# Patient Record
Sex: Male | Born: 1935 | Race: White | Hispanic: No | Marital: Married | Smoking: Never smoker
Health system: Southern US, Community
[De-identification: ages and names within clinical notes are randomized; demographics above are authoritative.]

## PROBLEM LIST (undated history)

## (undated) DIAGNOSIS — N289 Disorder of kidney and ureter, unspecified: Secondary | ICD-10-CM

## (undated) DIAGNOSIS — C679 Malignant neoplasm of bladder, unspecified: Secondary | ICD-10-CM

## (undated) DIAGNOSIS — K219 Gastro-esophageal reflux disease without esophagitis: Secondary | ICD-10-CM

## (undated) DIAGNOSIS — I495 Sick sinus syndrome: Secondary | ICD-10-CM

## (undated) DIAGNOSIS — I951 Orthostatic hypotension: Secondary | ICD-10-CM

## (undated) DIAGNOSIS — F0280 Dementia in other diseases classified elsewhere without behavioral disturbance: Secondary | ICD-10-CM

## (undated) DIAGNOSIS — Z950 Presence of cardiac pacemaker: Secondary | ICD-10-CM

## (undated) DIAGNOSIS — I10 Essential (primary) hypertension: Secondary | ICD-10-CM

## (undated) DIAGNOSIS — E039 Hypothyroidism, unspecified: Secondary | ICD-10-CM

## (undated) DIAGNOSIS — I4891 Unspecified atrial fibrillation: Secondary | ICD-10-CM

## (undated) DIAGNOSIS — I669 Occlusion and stenosis of unspecified cerebral artery: Secondary | ICD-10-CM

## (undated) DIAGNOSIS — G4730 Sleep apnea, unspecified: Secondary | ICD-10-CM

## (undated) HISTORY — DX: Sleep apnea, unspecified: G47.30

## (undated) HISTORY — DX: Gastro-esophageal reflux disease without esophagitis: K21.9

## (undated) HISTORY — PX: INSERT / REPLACE / REMOVE PACEMAKER: SUR710

## (undated) HISTORY — DX: Presence of cardiac pacemaker: Z95.0

## (undated) HISTORY — DX: Orthostatic hypotension: I95.1

## (undated) HISTORY — DX: Disorder of kidney and ureter, unspecified: N28.9

## (undated) HISTORY — DX: Malignant neoplasm of bladder, unspecified: C67.9

## (undated) HISTORY — DX: Sick sinus syndrome: I49.5

## (undated) HISTORY — PX: JOINT REPLACEMENT: SHX530

## (undated) HISTORY — PX: HAND SURGERY: SHX662

## (undated) HISTORY — DX: Unspecified atrial fibrillation: I48.91

## (undated) HISTORY — PX: HERNIA REPAIR: SHX51

## (undated) HISTORY — DX: Occlusion and stenosis of unspecified cerebral artery: I66.9

## (undated) HISTORY — DX: Hypothyroidism, unspecified: E03.9

## (undated) HISTORY — DX: Dementia in other diseases classified elsewhere, unspecified severity, without behavioral disturbance, psychotic disturbance, mood disturbance, and anxiety: F02.80

## (undated) HISTORY — DX: Essential (primary) hypertension: I10

---

## 2018-05-06 LAB — PROTIME-INR: INR: 2.8 — AB (ref 0.9–1.1)

## 2019-03-16 ENCOUNTER — Encounter: Payer: Self-pay | Admitting: Internal Medicine

## 2019-03-24 ENCOUNTER — Other Ambulatory Visit: Payer: Self-pay | Admitting: Nephrology

## 2019-03-24 ENCOUNTER — Encounter: Payer: Self-pay | Admitting: Internal Medicine

## 2019-03-24 ENCOUNTER — Ambulatory Visit (INDEPENDENT_AMBULATORY_CARE_PROVIDER_SITE_OTHER): Payer: Medicare Other | Admitting: Internal Medicine

## 2019-03-24 ENCOUNTER — Other Ambulatory Visit: Payer: Self-pay

## 2019-03-24 ENCOUNTER — Encounter (INDEPENDENT_AMBULATORY_CARE_PROVIDER_SITE_OTHER): Payer: Self-pay

## 2019-03-24 VITALS — BP 120/68 | HR 80 | Ht 68.0 in | Wt 196.6 lb

## 2019-03-24 DIAGNOSIS — I4821 Permanent atrial fibrillation: Secondary | ICD-10-CM

## 2019-03-24 DIAGNOSIS — R001 Bradycardia, unspecified: Secondary | ICD-10-CM | POA: Diagnosis not present

## 2019-03-24 DIAGNOSIS — I10 Essential (primary) hypertension: Secondary | ICD-10-CM

## 2019-03-24 DIAGNOSIS — N184 Chronic kidney disease, stage 4 (severe): Secondary | ICD-10-CM

## 2019-03-24 DIAGNOSIS — C679 Malignant neoplasm of bladder, unspecified: Secondary | ICD-10-CM

## 2019-03-24 NOTE — Patient Instructions (Signed)
Medication Instructions:  Your physician recommends that you continue on your current medications as directed. Please refer to the Current Medication list given to you today.   Labwork: None ordered.   Testing/Procedures: None ordered.   Follow-Up: Your physician recommends that you schedule a follow-up appointment in: one year with Dr Caryl Comes   Any Other Special Instructions Will Be Listed Below (If Applicable).     If you need a refill on your cardiac medications before your next appointment, please call your pharmacy.

## 2019-03-24 NOTE — Progress Notes (Signed)
ELECTROPHYSIOLOGY CONSULT NOTE  Patient ID: Cameron Walters, MRN: [id number], DOB/AGE: 08/26/1934 84 y.o. Admit date: (Not on file) Date of Consult: 03/24/2019  Primary Physician: Patient, No Pcp Per Primary Cardiologist: new     Cameron Walters is a 84 y.o. male who is being seen today for the evaluation of pacemaker and Afib at the request of Dr Valora Piccolo.    HPI Cameron Walters is a 84 y.o. male seen to establish care for Afib-permanent   and single chamber pacemaker-.Biotronik implanted 2018.  anticoagulation with coumadin and previously switched to apixaban dosed appropriately; without bleeding.  No edema or chest pain.  Not very active.  Denies dyspnea.  No palpitations.  Memory not great.  Recent worsening in renal function and has an ambulatory referral to nephrology  DATE TEST EF   7/20 Echo   50 % LAE        Date Cr K Hgb  3/20 1.36    8/20 1.8    11/20 2.93 4.6 12.1           Thromboembolic risk factors ( age  -2, HTN-1 ) for a CHADSVASc Score of 3    Past Medical History:  Diagnosis Date  . A-fib ([place])   . Alzheimer's dementia ([place])   . Blood clots in brain   . GERD (gastroesophageal reflux disease)   . Hypertension   . Hypothyroidism   . Kidney disease   . Malignant neoplasm of urinary bladder ([place])   . Orthostasis   . Pacemaker   . Sinus node dysfunction ([facility])   . Sleep apnea       Surgical History:  Past Surgical History:  Procedure Laterality Date  . HAND SURGERY    . HERNIA REPAIR    . INSERT / REPLACE / REMOVE PACEMAKER    . JOINT REPLACEMENT       Home Meds: Current Meds  Medication Sig  . apixaban (ELIQUIS) 2.5 MG TABS tablet Take 1 tablet by mouth 2 (two) times daily.  . Cholecalciferol 25 MCG (1000 UT) tablet Take 1 tablet by mouth daily.  Marland Kitchen levothyroxine (SYNTHROID) 50 MCG tablet Take 50 mcg by mouth daily before breakfast.  . Magnesium 250 MG TABS Take by mouth.  . memantine (NAMENDA) 10 MG tablet Take 10 mg by mouth 2  (two) times daily.  . metoprolol succinate (TOPROL-XL) 50 MG 24 hr tablet Take 1 tablet by mouth daily.  . Multiple Vitamin (MULTIVITAMIN) tablet Take 1 tablet by mouth daily.  Marland Kitchen omeprazole (PRILOSEC) 20 MG capsule Take 20 mg by mouth daily.  . pravastatin (PRAVACHOL) 20 MG tablet Take 20 mg by mouth daily.    Allergies:  Allergies  Allergen Reactions  . Aricept [Donepezil Hcl]     DIZZINESS   . Erythromycin   . Hydrocodone   . Morphine And Related   . Pristiq [Desvenlafaxine Succinate Er]   . Skelaxin [Metaxalone]   . Tetanus Toxoids   . Tetracyclines & Related   . Viloxazine   . Voltaren [Diclofenac Sodium]   . Zoloft [Sertraline Hcl]     Social History   Socioeconomic History  . Marital status: Married    Spouse name: Not on file  . Number of children: Not on file  . Years of education: Not on file  . Highest education level: Not on file  Occupational History  . Not on file  Tobacco Use  . Smoking status: Never Smoker  . Smokeless tobacco: Never Used  Substance and Sexual Activity  . Alcohol use: Not on file  . Drug use: Not on file  . Sexual activity: Not on file    Comment: MARRIED  Other Topics Concern  . Not on file  Social History Narrative  . Not on file   Social Determinants of Health   Financial Resource Strain:   . Difficulty of Paying Living Expenses: Not on file  Food Insecurity:   . Worried About Charity fundraiser in the Last Year: Not on file  . Ran Out of Food in the Last Year: Not on file  Transportation Needs:   . Lack of Transportation (Medical): Not on file  . Lack of Transportation (Non-Medical): Not on file  Physical Activity:   . Days of Exercise per Week: Not on file  . Minutes of Exercise per Session: Not on file  Stress:   . Feeling of Stress : Not on file  Social Connections:   . Frequency of Communication with Friends and Family: Not on file  . Frequency of Social Gatherings with Friends and Family: Not on file  .  Attends Religious Services: Not on file  . Active Member of Clubs or Organizations: Not on file  . Attends Archivist Meetings: Not on file  . Marital Status: Not on file  Intimate Partner Violence:   . Fear of Current or Ex-Partner: Not on file  . Emotionally Abused: Not on file  . Physically Abused: Not on file  . Sexually Abused: Not on file     Family History  Problem Relation Age of Onset  . Dementia Father   . Dementia Sister   . Dementia Brother   . Cancer - Prostate Brother   . Heart disease Brother      ROS:  Please see the history of present illness.     All other systems reviewed and negative.    Physical Exam:  Blood pressure 120/68, pulse 80, height 5\' 8"  (1.727 m), weight 196 lb 9.6 oz (89.2 kg), SpO2 90 %. General: Well developed, well nourished male in no acute distress. Head: Normocephalic, atraumatic, sclera non-icteric, no xanthomas, nares are without discharge. EENT: normal  Lymph Nodes:  none Neck: Negative for carotid bruits. JVD not elevated. Back:without scoliosis kyphosis  Device pocket well healed; without hematoma or erythema.  There is no tethering  Lungs: Clear bilaterally to auscultation without wheezes, rales, or rhonchi. Breathing is unlabored. Heart: Irregularly irregular rate and rhythm with no murmur . No rubs, or gallops appreciated. Abdomen: Soft, non-tender, non-distended with normoactive bowel sounds. No hepatomegaly. No rebound/guarding. No obvious abdominal masses. Msk:  Strength and tone appear normal for age. Extremities: No clubbing or cyanosis.  Trace  edema.  Distal pedal pulses are 2+ and equal bilaterally. Skin: Warm and Dry Neuro: Alert and oriented X 3. CN III-XII intact Grossly normal sensory and motor function . Psych:  Responds to questions appropriately with a normal affect.      Labs: Cardiac Enzymes No results for input(s): CKTOTAL, CKMB, TROPONINI in the last 72 hours. CBC No results found for: WBC, HGB,  HCT, MCV, PLT PROTIME: No results for input(s): LABPROT, INR in the last 72 hours. Chemistry No results for input(s): NA, K, CL, CO2, BUN, CREATININE, CALCIUM, PROT, BILITOT, ALKPHOS, ALT, AST, GLUCOSE in the last 168 hours.  Invalid input(s): LABALBU Lipids No results found for: CHOL, HDL, LDLCALC, TRIG BNP No results found for: PROBNP Thyroid Function Tests: No results for input(s): TSH, T4TOTAL,  T3FREE, THYROIDAB in the last 72 hours.  Invalid input(s): FREET3 Miscellaneous No results found for: DDIMER  Radiology/Studies:  No results found.  EKG: atrial fib @ 80 -/09/37 occ V pacing   Assessment and Plan:  Atrial fibrillation-permanent  Bradycardia  Pacemaker-Biotronik  The patient's device was interrogated.  The information was reviewed. No changes were made in the programming.     Renal insufficiency grade 4 acute on chronic  Congestive heart failure-chronic-diastolic  Dementia   Device interrogation demonstrates mean heart rates in the 70s-80s with activity.  Well controlled.  We will continue metoprolol.  Recently transitioned to Eliquis and tolerating well.  I am glad that he has renal consultation as there is been abrupt change in his creatinine over the last year.  We will transfer care of his device to our clinic. Cameron Walters

## 2019-03-28 ENCOUNTER — Telehealth: Payer: Self-pay | Admitting: Internal Medicine

## 2019-03-28 NOTE — Telephone Encounter (Signed)
New message   Pt POA calling about the make and model number and serial number of his device. He is trying to get scheduled for an MRI. Please call or fax information to [phone].

## 2019-03-30 ENCOUNTER — Ambulatory Visit
Admission: RE | Admit: 2019-03-30 | Discharge: 2019-03-30 | Disposition: A | Discharge status: Home / Self Care | Payer: Medicare Other | Source: Ambulatory Visit | Attending: Nephrology | Admitting: Nephrology

## 2019-03-30 DIAGNOSIS — D631 Anemia in chronic kidney disease: Secondary | ICD-10-CM

## 2019-03-30 DIAGNOSIS — C679 Malignant neoplasm of bladder, unspecified: Secondary | ICD-10-CM

## 2019-03-30 DIAGNOSIS — I10 Essential (primary) hypertension: Secondary | ICD-10-CM

## 2019-03-30 DIAGNOSIS — N184 Chronic kidney disease, stage 4 (severe): Secondary | ICD-10-CM

## 2019-03-30 NOTE — Telephone Encounter (Signed)
Spoke with Tim (DPR). Advised that patient does have an MRI-conditional device and lead. Will plan to send device/lead info via MyChart. Tim in agreement with plan, no further questions at this time.

## 2019-03-30 NOTE — Telephone Encounter (Signed)
Follow up   Pt POA called about his request. I told him about the MyChart message from the RN. He said Dr. Caryl Comes said he could have the MRI, he just needed the information to give to the MRI place. He asked for a call back.

## 2019-04-21 LAB — CUP PACEART INCLINIC DEVICE CHECK
Date Time Interrogation Session: 20201210102921
Implantable Lead Implant Date: 20181211
Implantable Lead Location: 753860
Implantable Lead Model: 377
Implantable Lead Serial Number: 80620893
Implantable Pulse Generator Implant Date: 20181211
Pulse Gen Model: 407157
Pulse Gen Serial Number: 69171927

## 2019-05-16 ENCOUNTER — Emergency Department (HOSPITAL_COMMUNITY)
Admission: EM | Admit: 2019-05-16 | Discharge: 2019-05-16 | Disposition: A | Discharge status: Home / Self Care | Payer: Medicare Other | Attending: Emergency Medicine | Admitting: Emergency Medicine

## 2019-05-16 ENCOUNTER — Encounter (HOSPITAL_COMMUNITY): Payer: Self-pay | Admitting: Emergency Medicine

## 2019-05-16 ENCOUNTER — Other Ambulatory Visit: Payer: Self-pay

## 2019-05-16 ENCOUNTER — Emergency Department (HOSPITAL_COMMUNITY): Payer: Medicare Other

## 2019-05-16 DIAGNOSIS — Z9660 Presence of unspecified orthopedic joint implant: Secondary | ICD-10-CM | POA: Diagnosis not present

## 2019-05-16 DIAGNOSIS — R55 Syncope and collapse: Secondary | ICD-10-CM | POA: Diagnosis present

## 2019-05-16 DIAGNOSIS — Z8551 Personal history of malignant neoplasm of bladder: Secondary | ICD-10-CM | POA: Diagnosis not present

## 2019-05-16 DIAGNOSIS — F0280 Dementia in other diseases classified elsewhere without behavioral disturbance: Secondary | ICD-10-CM | POA: Diagnosis not present

## 2019-05-16 DIAGNOSIS — I10 Essential (primary) hypertension: Secondary | ICD-10-CM | POA: Insufficient documentation

## 2019-05-16 DIAGNOSIS — E860 Dehydration: Secondary | ICD-10-CM | POA: Diagnosis not present

## 2019-05-16 DIAGNOSIS — Z950 Presence of cardiac pacemaker: Secondary | ICD-10-CM | POA: Diagnosis not present

## 2019-05-16 DIAGNOSIS — G309 Alzheimer's disease, unspecified: Secondary | ICD-10-CM | POA: Diagnosis not present

## 2019-05-16 DIAGNOSIS — E039 Hypothyroidism, unspecified: Secondary | ICD-10-CM | POA: Insufficient documentation

## 2019-05-16 LAB — COMPREHENSIVE METABOLIC PANEL
ALT: 14 U/L (ref 0–44)
AST: 16 U/L (ref 15–41)
Albumin: 2.9 g/dL — ABNORMAL LOW (ref 3.5–5.0)
Alkaline Phosphatase: 45 U/L (ref 38–126)
Anion gap: 7 (ref 5–15)
BUN: 23 mg/dL (ref 8–23)
CO2: 24 mmol/L (ref 22–32)
Calcium: 8.6 mg/dL — ABNORMAL LOW (ref 8.9–10.3)
Chloride: 109 mmol/L (ref 98–111)
Creatinine, Ser: 1.95 mg/dL — ABNORMAL HIGH (ref 0.61–1.24)
GFR calc Af Amer: 36 mL/min — ABNORMAL LOW (ref >60)
GFR calc non Af Amer: 31 mL/min — ABNORMAL LOW (ref >60)
Glucose, Bld: 118 mg/dL — ABNORMAL HIGH (ref 70–99)
Potassium: 4.4 mmol/L (ref 3.5–5.1)
Sodium: 140 mmol/L (ref 135–145)
Total Bilirubin: 0.8 mg/dL (ref 0.3–1.2)
Total Protein: 5.6 g/dL — ABNORMAL LOW (ref 6.5–8.1)

## 2019-05-16 LAB — CBC WITH DIFFERENTIAL/PLATELET
Abs Immature Granulocytes: 0.02 10*3/uL (ref 0.00–0.07)
Basophils Absolute: 0.1 10*3/uL (ref 0.0–0.1)
Basophils Relative: 1 %
Eosinophils Absolute: 0.1 10*3/uL (ref 0.0–0.5)
Eosinophils Relative: 2 %
HCT: 34.8 % — ABNORMAL LOW (ref 39.0–52.0)
Hemoglobin: 11.6 g/dL — ABNORMAL LOW (ref 13.0–17.0)
Immature Granulocytes: 0 %
Lymphocytes Relative: 20 %
Lymphs Abs: 1.1 10*3/uL (ref 0.7–4.0)
MCH: 32.2 pg (ref 26.0–34.0)
MCHC: 33.3 g/dL (ref 30.0–36.0)
MCV: 96.7 fL (ref 80.0–100.0)
Monocytes Absolute: 0.3 10*3/uL (ref 0.1–1.0)
Monocytes Relative: 6 %
Neutro Abs: 3.9 10*3/uL (ref 1.7–7.7)
Neutrophils Relative %: 71 %
Platelets: 143 10*3/uL — ABNORMAL LOW (ref 150–400)
RBC: 3.6 MIL/uL — ABNORMAL LOW (ref 4.22–5.81)
RDW: 13 % (ref 11.5–15.5)
WBC: 5.4 10*3/uL (ref 4.0–10.5)
nRBC: 0 % (ref 0.0–0.2)

## 2019-05-16 LAB — TROPONIN I (HIGH SENSITIVITY): Troponin I (High Sensitivity): 4 ng/L (ref <18)

## 2019-05-16 MED ORDER — LACTATED RINGERS IV BOLUS
1000.0000 mL | Freq: Once | INTRAVENOUS | Status: AC
  Administered 2019-05-16: 1000 mL via INTRAVENOUS

## 2019-05-16 NOTE — ED Notes (Signed)
Patient verbalizes understanding of discharge instructions. Opportunity for questioning and answers were provided. Armband removed by staff, pt discharged from ED.  

## 2019-05-16 NOTE — ED Notes (Signed)
Biotronik rep contacted to interrogate pacemaker.

## 2019-05-16 NOTE — ED Triage Notes (Signed)
Per EMS: pt here from [place] with wife where pt had a near syncopal episode.  Pt reports feeling dizzy and wife assisted pt to ground  Pt's initial BP with EMS 82/44, after fluid administration BP 112/56.  Pt denies hitting head or any pain.

## 2019-05-16 NOTE — ED Provider Notes (Signed)
Emergency Department Provider Note   I have reviewed the triage vital signs and the nursing notes.   HISTORY  Chief Complaint No chief complaint on file.   HPI Cameron Walters is a 85 y.o. male with medical problems documented below who presents the emergency department today secondary to near syncopal episode.  He apparently was with his wife he started feeling dizzy and he was assisted to the ground.  EMS called his blood pressure is a 82/44 which improved to 112/56 with fluids.  Patient is asymptomatic this time.  No traumatic injuries.  He is with his cousin who states that the patient has severe dementia and his memory issues are not new.   No other associated or modifying symptoms.    Past Medical History:  Diagnosis Date  . A-fib ([place])   . Alzheimer's dementia ([place])   . Blood clots in brain   . GERD (gastroesophageal reflux disease)   . Hypertension   . Hypothyroidism   . Kidney disease   . Malignant neoplasm of urinary bladder ([place])   . Orthostasis   . Pacemaker   . Sinus node dysfunction ([facility])   . Sleep apnea     There are no problems to display for this patient.   Past Surgical History:  Procedure Laterality Date  . HAND SURGERY    . HERNIA REPAIR    . INSERT / REPLACE / REMOVE PACEMAKER    . JOINT REPLACEMENT      Current Outpatient Rx  . Order #: [id number] Class: Historical Med  . Order #: [id number] Class: Historical Med  . Order #: [id number] Class: Historical Med  . Order #: [id number] Class: Historical Med  . Order #: [id number] Class: Historical Med  . Order #: [id number] Class: Historical Med  . Order #: [id number] Class: Historical Med  . Order #: [id number] Class: Historical Med  . Order #: [id number] Class: Historical Med    Allergies Aricept [donepezil hcl], Erythromycin, Hydrocodone, Morphine and related, Pristiq [desvenlafaxine succinate er], Skelaxin [metaxalone], Tetanus toxoids, Tetracyclines & related, Viloxazine, Voltaren [diclofenac sodium],  and Zoloft [sertraline hcl]  Family History  Problem Relation Age of Onset  . Dementia Father   . Dementia Sister   . Dementia Brother   . Cancer - Prostate Brother   . Heart disease Brother     Social History Social History   Tobacco Use  . Smoking status: Never Smoker  . Smokeless tobacco: Never Used  Substance Use Topics  . Alcohol use: Not on file  . Drug use: Not on file    Review of Systems  All other systems negative except as documented in the HPI. All pertinent positives and negatives as reviewed in the HPI. ____________________________________________   PHYSICAL EXAM:  VITAL SIGNS: ED Triage Vitals  Enc Vitals Group     BP 05/16/19 1401 98/67     Pulse Rate 05/16/19 1402 78     Resp 05/16/19 1401 13     Temp --      Temp src --      SpO2 05/16/19 1357 98 %    Constitutional: Alert and disoriented per baseline. Well appearing and in no acute distress. Eyes: Conjunctivae are normal. PERRL. EOMI. Head: Atraumatic. Nose: No congestion/rhinnorhea. Mouth/Throat: Mucous membranes are moist.  Oropharynx non-erythematous. Neck: No stridor.  No meningeal signs.   Cardiovascular: Normal rate, regular rhythm. Good peripheral circulation. Grossly normal heart sounds.   Respiratory: Normal respiratory effort.  No retractions. Lungs CTAB. Gastrointestinal: Soft and nontender. No distention.  Musculoskeletal: No lower extremity  tenderness nor edema. No gross deformities of extremities. Neurologic:  Normal speech and language. No gross focal neurologic deficits are appreciated.  Skin:  Skin is warm, dry and intact. No rash noted.   ____________________________________________   LABS (all labs ordered are listed, but only abnormal results are displayed)  Labs Reviewed  CBC WITH DIFFERENTIAL/PLATELET - Abnormal; Notable for the following components:      Result Value   RBC 3.60 (*)    Hemoglobin 11.6 (*)    HCT 34.8 (*)    Platelets 143 (*)    All other  components within normal limits  COMPREHENSIVE METABOLIC PANEL - Abnormal; Notable for the following components:   Glucose, Bld 118 (*)    Creatinine, Ser 1.95 (*)    Calcium 8.6 (*)    Total Protein 5.6 (*)    Albumin 2.9 (*)    GFR calc non Af Amer 31 (*)    GFR calc Af Amer 36 (*)    All other components within normal limits  TROPONIN I (HIGH SENSITIVITY)   ____________________________________________  EKG   EKG Interpretation  Date/Time:  Monday May 16 2019 14:05:37 EST Ventricular Rate:  77 PR Interval:    QRS Duration: 176 QT Interval:  475 QTC Calculation: 535 R Axis:   -79 Text Interpretation: Atrial fibrillation Nonspecific IVCD with LAD no STEMI Confirmed by Charlesetta Shanks [phone]) on 05/16/2019 2:10:52 PM       ____________________________________________  RADIOLOGY  No results found.  ____________________________________________   PROCEDURES  Procedure(s) performed:   Procedures   ____________________________________________   INITIAL IMPRESSION / ASSESSMENT AND PLAN / ED COURSE  EKG with intermittently paced rhythm.  Pacer was interrogated by the Biotronik rep without any arrhythmia events today.  Interrogation unremarkable. VS improved with fluids. Not orthostatic afterwards. Ambulated without difficulty. No longer symptomatic. Suspect dehydration as cause. Plan for follow up with pcp.   Pertinent labs & imaging results that were available during my care of the patient were reviewed by me and considered in my medical decision making (see chart for details).  A medical screening exam was performed and I feel the patient has had an appropriate workup for their chief complaint at this time and likelihood of emergent condition existing is low. They have been counseled on decision, discharge, follow up and which symptoms necessitate immediate return to the emergency department. They or their family verbally stated understanding and agreement with  plan and discharged in stable condition.   ____________________________________________  FINAL CLINICAL IMPRESSION(S) / ED DIAGNOSES  Final diagnoses:  Dehydration     MEDICATIONS GIVEN DURING THIS VISIT:  Medications  lactated ringers bolus 1,000 mL (0 mLs Intravenous Stopped 05/16/19 1606)     NEW OUTPATIENT MEDICATIONS STARTED DURING THIS VISIT:  Discharge Medication List as of 05/16/2019  7:13 PM      Note:  This note was prepared with assistance of Dragon voice recognition software. Occasional wrong-word or sound-a-like substitutions may have occurred due to the inherent limitations of voice recognition software.   Merrily Pew, MD 05/18/19 [phone]

## 2019-05-18 ENCOUNTER — Telehealth: Payer: Self-pay

## 2019-05-18 ENCOUNTER — Telehealth: Payer: Self-pay | Admitting: Internal Medicine

## 2019-05-18 NOTE — Telephone Encounter (Signed)
Form faxed back to both requested numbers. Confirmation received.

## 2019-05-18 NOTE — Telephone Encounter (Signed)
Encounter not needed

## 2019-05-18 NOTE — Telephone Encounter (Signed)
The pt POA called because [facility] has not received the form for the pt MRI. I told him we have received the form but we did not have a fax number. I called [facility] and got the fax number. The poa also provided his fax number just to make sure they get the form.

## 2019-06-23 ENCOUNTER — Ambulatory Visit (INDEPENDENT_AMBULATORY_CARE_PROVIDER_SITE_OTHER): Payer: Medicare Other | Admitting: *Deleted

## 2019-06-23 DIAGNOSIS — Z950 Presence of cardiac pacemaker: Secondary | ICD-10-CM

## 2019-06-23 LAB — CUP PACEART REMOTE DEVICE CHECK
Battery Remaining Percentage: 80 %
Brady Statistic RV Percent Paced: 62 %
Date Time Interrogation Session: 20210311155853
Implantable Lead Implant Date: 20181211
Implantable Lead Location: 753860
Implantable Lead Model: 377
Implantable Lead Serial Number: 80620893
Implantable Pulse Generator Implant Date: 20181211
Lead Channel Impedance Value: 566 Ohm
Lead Channel Pacing Threshold Amplitude: 1 V
Lead Channel Pacing Threshold Pulse Width: 0.4 ms
Lead Channel Sensing Intrinsic Amplitude: 18.5 mV
Lead Channel Setting Pacing Amplitude: 2 V
Lead Channel Setting Pacing Pulse Width: 0.4 ms
Pulse Gen Model: 407157
Pulse Gen Serial Number: 69171927

## 2019-06-24 NOTE — Progress Notes (Signed)
PPM Remote  

## 2019-08-22 ENCOUNTER — Telehealth: Payer: Self-pay | Admitting: Emergency Medicine

## 2019-08-22 NOTE — Telephone Encounter (Signed)
LMOM for daughter Caren Griffins to call office.  Patient has dementia. No connection with monitor for > 21 days.

## 2019-08-29 NOTE — Telephone Encounter (Signed)
LMOM to call device clinic, office # provided. Need to notify that no connection with home monitor. 2nd attempt.

## 2019-08-31 NOTE — Telephone Encounter (Signed)
Spoke to Maynard ([facility]), states she will try to find monitor and plug in, reports they have moved and not sure where she put it. Advised if she can not find it to please call DC back. Verbalizes understanding.

## 2019-09-01 NOTE — Telephone Encounter (Signed)
Called patient to see if monitor was plugged in. When called, phone did not ring, went straight to LM. LMOVM.

## 2019-09-01 NOTE — Telephone Encounter (Signed)
Wife called Cameron Walters (DPR), states she plugged the monitor in last night. Monitor checked in Biotronik and updated as of today 09/01/19. Advised to call DC back for any further questions or concerns. Verbalizes understanding.

## 2019-09-15 ENCOUNTER — Encounter (HOSPITAL_COMMUNITY): Payer: Self-pay | Admitting: Emergency Medicine

## 2019-09-15 ENCOUNTER — Emergency Department (HOSPITAL_COMMUNITY)
Admission: EM | Admit: 2019-09-15 | Discharge: 2019-09-15 | Disposition: A | Discharge status: Home / Self Care | Payer: Medicare Other | Attending: Emergency Medicine | Admitting: Emergency Medicine

## 2019-09-15 ENCOUNTER — Other Ambulatory Visit: Payer: Self-pay

## 2019-09-15 ENCOUNTER — Emergency Department (HOSPITAL_COMMUNITY): Payer: Medicare Other

## 2019-09-15 DIAGNOSIS — I4891 Unspecified atrial fibrillation: Secondary | ICD-10-CM | POA: Diagnosis not present

## 2019-09-15 DIAGNOSIS — Z79899 Other long term (current) drug therapy: Secondary | ICD-10-CM | POA: Insufficient documentation

## 2019-09-15 DIAGNOSIS — Z7901 Long term (current) use of anticoagulants: Secondary | ICD-10-CM | POA: Diagnosis not present

## 2019-09-15 DIAGNOSIS — G309 Alzheimer's disease, unspecified: Secondary | ICD-10-CM | POA: Diagnosis not present

## 2019-09-15 DIAGNOSIS — R42 Dizziness and giddiness: Secondary | ICD-10-CM | POA: Insufficient documentation

## 2019-09-15 DIAGNOSIS — R0602 Shortness of breath: Secondary | ICD-10-CM | POA: Diagnosis not present

## 2019-09-15 DIAGNOSIS — Z950 Presence of cardiac pacemaker: Secondary | ICD-10-CM | POA: Diagnosis not present

## 2019-09-15 DIAGNOSIS — I10 Essential (primary) hypertension: Secondary | ICD-10-CM | POA: Insufficient documentation

## 2019-09-15 LAB — TROPONIN I (HIGH SENSITIVITY): Troponin I (High Sensitivity): 4 ng/L (ref <18)

## 2019-09-15 LAB — BASIC METABOLIC PANEL
Anion gap: 7 (ref 5–15)
BUN: 27 mg/dL — ABNORMAL HIGH (ref 8–23)
CO2: 27 mmol/L (ref 22–32)
Calcium: 9.3 mg/dL (ref 8.9–10.3)
Chloride: 106 mmol/L (ref 98–111)
Creatinine, Ser: 2.09 mg/dL — ABNORMAL HIGH (ref 0.61–1.24)
GFR calc Af Amer: 33 mL/min — ABNORMAL LOW (ref >60)
GFR calc non Af Amer: 29 mL/min — ABNORMAL LOW (ref >60)
Glucose, Bld: 98 mg/dL (ref 70–99)
Potassium: 4.6 mmol/L (ref 3.5–5.1)
Sodium: 140 mmol/L (ref 135–145)

## 2019-09-15 LAB — CBC WITH DIFFERENTIAL/PLATELET
Abs Immature Granulocytes: 0.01 10*3/uL (ref 0.00–0.07)
Basophils Absolute: 0.1 10*3/uL (ref 0.0–0.1)
Basophils Relative: 1 %
Eosinophils Absolute: 0.2 10*3/uL (ref 0.0–0.5)
Eosinophils Relative: 5 %
HCT: 36.9 % — ABNORMAL LOW (ref 39.0–52.0)
Hemoglobin: 12.1 g/dL — ABNORMAL LOW (ref 13.0–17.0)
Immature Granulocytes: 0 %
Lymphocytes Relative: 30 %
Lymphs Abs: 1.5 10*3/uL (ref 0.7–4.0)
MCH: 31.7 pg (ref 26.0–34.0)
MCHC: 32.8 g/dL (ref 30.0–36.0)
MCV: 96.6 fL (ref 80.0–100.0)
Monocytes Absolute: 0.5 10*3/uL (ref 0.1–1.0)
Monocytes Relative: 11 %
Neutro Abs: 2.6 10*3/uL (ref 1.7–7.7)
Neutrophils Relative %: 53 %
Platelets: 180 10*3/uL (ref 150–400)
RBC: 3.82 MIL/uL — ABNORMAL LOW (ref 4.22–5.81)
RDW: 13.2 % (ref 11.5–15.5)
WBC: 4.9 10*3/uL (ref 4.0–10.5)
nRBC: 0 % (ref 0.0–0.2)

## 2019-09-15 MED ORDER — SODIUM CHLORIDE 0.9 % IV SOLN
INTRAVENOUS | Status: DC
  Administered 2019-09-15: 30 mL/h via INTRAVENOUS

## 2019-09-15 NOTE — ED Triage Notes (Signed)
Pt brought in by family for feeling dizzy and SOB after out shopping earlier. Family checked BP and O2 level which was normal range. Checked his heart with monitor and showed afib 4 different times. Pt's PCP advised to go to ED.

## 2019-09-15 NOTE — ED Provider Notes (Signed)
[facility] DEPT Provider Note   CSN: [id number] Arrival date & time: 09/15/19  1728     History Chief Complaint  Patient presents with  . Shortness of Breath  . Dizziness    Cameron Walters is a 85 y.o. male.  85 year old male with history of dementia who presents after having a brief episode of being short of breath and dizzy.  Heart rate monitor showed possible atrial fibrillation.  Review the old chart shows the patient does have a history of A. fib and is on Eliquis.  Patient is at his baseline at this time.  There has been no new medications.  His caregiver states that patient has been compliant with his meds.        Past Medical History:  Diagnosis Date  . A-fib ([place])   . Alzheimer's dementia ([place])   . Blood clots in brain   . GERD (gastroesophageal reflux disease)   . Hypertension   . Hypothyroidism   . Kidney disease   . Malignant neoplasm of urinary bladder ([place])   . Orthostasis   . Pacemaker   . Sinus node dysfunction ([facility])   . Sleep apnea     There are no problems to display for this patient.   Past Surgical History:  Procedure Laterality Date  . HAND SURGERY    . HERNIA REPAIR    . INSERT / REPLACE / REMOVE PACEMAKER    . JOINT REPLACEMENT         Family History  Problem Relation Age of Onset  . Dementia Father   . Dementia Sister   . Dementia Brother   . Cancer - Prostate Brother   . Heart disease Brother     Social History   Tobacco Use  . Smoking status: Never Smoker  . Smokeless tobacco: Never Used  Substance Use Topics  . Alcohol use: Not on file  . Drug use: Not on file    Home Medications Prior to Admission medications   Medication Sig Start Date End Date Taking? Authorizing Provider  apixaban (ELIQUIS) 2.5 MG TABS tablet Take 1 tablet by mouth 2 (two) times daily. 03/01/19   [provider]  Cholecalciferol 25 MCG (1000 UT) tablet Take 1 tablet by mouth daily.    [provider]  levothyroxine (SYNTHROID) 50 MCG tablet Take 50 mcg by mouth daily before breakfast.    [provider]  Magnesium 250 MG TABS Take by mouth.    [provider]  memantine (NAMENDA) 10 MG tablet Take 10 mg by mouth 2 (two) times daily.    [provider]  metoprolol succinate (TOPROL-XL) 50 MG 24 hr tablet Take 1 tablet by mouth daily. 04/28/18   [provider]  Multiple Vitamin (MULTIVITAMIN) tablet Take 1 tablet by mouth daily.    [provider]  omeprazole (PRILOSEC) 20 MG capsule Take 20 mg by mouth daily.    [provider]  pravastatin (PRAVACHOL) 20 MG tablet Take 20 mg by mouth daily.    [provider]    Allergies    Aricept [donepezil hcl], Erythromycin, Hydrocodone, Morphine and related, Pristiq [desvenlafaxine succinate er], Skelaxin [metaxalone], Tetanus toxoids, Tetracyclines & related, Viloxazine, Voltaren [diclofenac sodium], and Zoloft [sertraline hcl]  Review of Systems   Review of Systems  All other systems reviewed and are negative.   Physical Exam Updated Vital Signs BP (!) 142/76   Pulse 73   Temp 98.1 F (36.7 C) (Oral) Comment: Simultaneous filing.  User may not have seen previous data. Comment (Src): Simultaneous filing. User may not have seen previous data.  Resp 14   SpO2 99%   Physical Exam Vitals and nursing note reviewed.  Constitutional:      General: He is not in acute distress.    Appearance: Normal appearance. He is well-developed. He is not toxic-appearing.  HENT:     Head: Normocephalic and atraumatic.  Eyes:     General: Lids are normal.     Conjunctiva/sclera: Conjunctivae normal.     Pupils: Pupils are equal, round, and reactive to light.  Neck:     Thyroid: No thyroid mass.     Trachea: No tracheal deviation.  Cardiovascular:     Rate and Rhythm: Normal rate and regular rhythm.     Heart sounds: Normal heart sounds. No murmur. No gallop.   Pulmonary:     Effort:  Pulmonary effort is normal. No respiratory distress.     Breath sounds: Normal breath sounds. No stridor. No decreased breath sounds, wheezing, rhonchi or rales.  Abdominal:     General: Bowel sounds are normal. There is no distension.     Palpations: Abdomen is soft.     Tenderness: There is no abdominal tenderness. There is no rebound.  Musculoskeletal:        General: No tenderness. Normal range of motion.     Cervical back: Normal range of motion and neck supple.  Skin:    General: Skin is warm and dry.     Findings: No abrasion or rash.  Neurological:     General: No focal deficit present.     Mental Status: He is alert and oriented to person, place, and time. Mental status is at baseline.     GCS: GCS eye subscore is 4. GCS verbal subscore is 5. GCS motor subscore is 6.     Cranial Nerves: No cranial nerve deficit.     Sensory: No sensory deficit.  Psychiatric:        Attention and Perception: Attention normal.     ED Results / Procedures / Treatments   Labs (all labs ordered are listed, but only abnormal results are displayed) Labs Reviewed  CBC WITH DIFFERENTIAL/PLATELET  BASIC METABOLIC PANEL  TROPONIN I (HIGH SENSITIVITY)    EKG EKG Interpretation  Date/Time:  Thursday September 15 2019 17:45:24 EDT Ventricular Rate:  85 PR Interval:    QRS Duration: 150 QT Interval:  407 QTC Calculation: 484 R Axis:   -73 Text Interpretation: Sinus rhythm Left bundle branch block 12 Lead; [place] Confirmed by Lacretia Leigh ([id number]) on 09/15/2019 7:03:31 PM   Radiology DG Chest 2 View  Result Date: 09/15/2019 CLINICAL DATA:  Shortness of breath EXAM: CHEST - 2 VIEW COMPARISON:  05/16/2019 FINDINGS: Left single lead pacer remains in place, unchanged. Heart and mediastinal contours are within normal limits. No focal opacities or effusions. No acute bony abnormality. IMPRESSION: No active cardiopulmonary disease. Electronically Signed   By: Rolm Baptise M.D.   On: 09/15/2019 18:34      Procedures Procedures (including critical care time)  Medications Ordered in ED Medications  0.9 %  sodium chloride infusion (has no administration in time range)    ED Course  I have reviewed the triage vital signs and the nursing notes.  Pertinent labs & imaging results that were available during my care of the patient were reviewed by me and considered in my medical decision making (see chart for details).    MDM  Rules/Calculators/A&P                      Patient monitored here and has been stable.  He does have a history of A. fib and is on Eliquis.  His rate has been controlled.  Labs are reassuring and patient will be discharged Final Clinical Impression(s) / ED Diagnoses Final diagnoses:  None    Rx / DC Orders ED Discharge Orders    None       Lacretia Leigh, MD 09/15/19 2127

## 2019-09-22 ENCOUNTER — Ambulatory Visit (INDEPENDENT_AMBULATORY_CARE_PROVIDER_SITE_OTHER): Payer: Medicare Other | Admitting: *Deleted

## 2019-09-22 DIAGNOSIS — I4821 Permanent atrial fibrillation: Secondary | ICD-10-CM

## 2019-09-22 LAB — CUP PACEART REMOTE DEVICE CHECK
Battery Remaining Percentage: 80 %
Brady Statistic RV Percent Paced: 65 %
Date Time Interrogation Session: 20210610082653
Implantable Lead Implant Date: 20181211
Implantable Lead Location: 753860
Implantable Lead Model: 377
Implantable Lead Serial Number: 80620893
Implantable Pulse Generator Implant Date: 20181211
Lead Channel Impedance Value: 546 Ohm
Lead Channel Pacing Threshold Amplitude: 1.1 V
Lead Channel Pacing Threshold Pulse Width: 0.4 ms
Lead Channel Sensing Intrinsic Amplitude: 19.7 mV
Lead Channel Setting Pacing Amplitude: 2 V
Lead Channel Setting Pacing Pulse Width: 0.4 ms
Pulse Gen Model: 407157
Pulse Gen Serial Number: 69171927

## 2019-09-23 NOTE — Progress Notes (Signed)
Remote pacemaker transmission.   

## 2019-10-10 ENCOUNTER — Other Ambulatory Visit: Payer: Self-pay | Admitting: Internal Medicine

## 2019-12-20 IMAGING — US US RENAL
1 series · 13 of 25 positions shown · non-contrast
Comparison: None.

CLINICAL DATA: Initial evaluation for chronic kidney disease stage
4. History of essential hypertension.

EXAM:
RENAL / URINARY TRACT ULTRASOUND COMPLETE

[Series 1: us renal · 0.23mm/px · 13 of 65 slices shown]
[im 1/65]
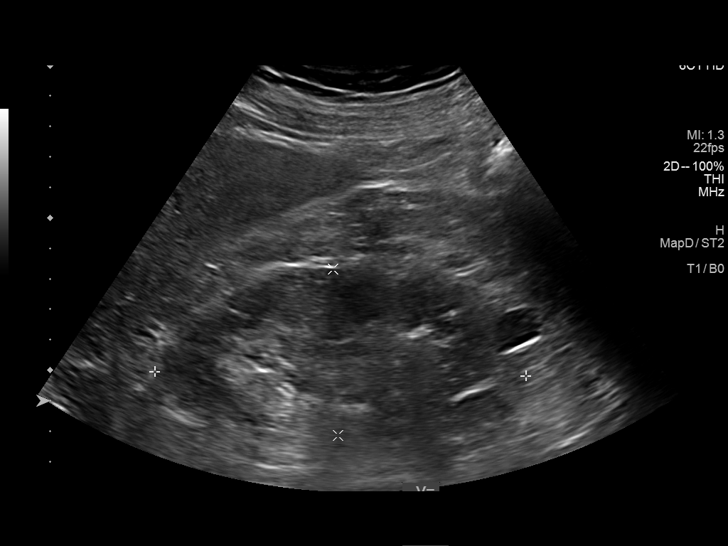
[im 6/65]
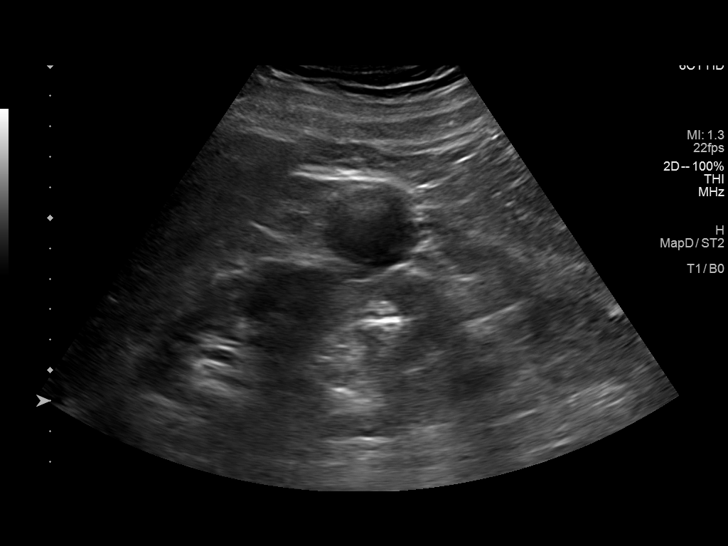
[im 11/65]
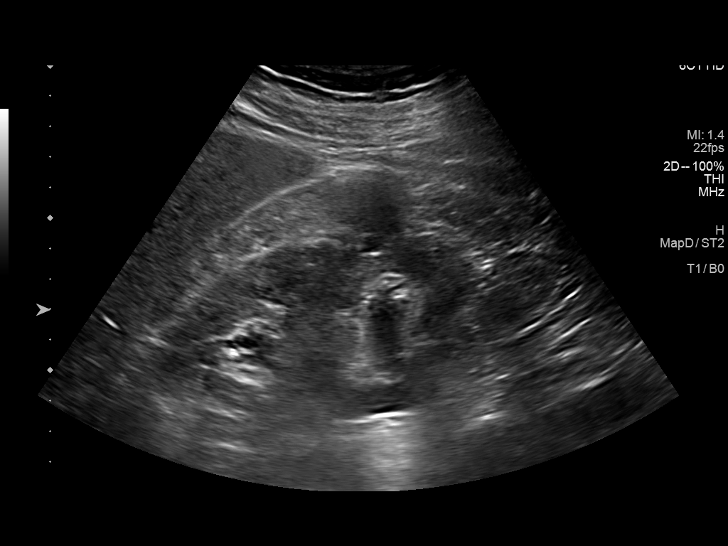
[im 17/65]
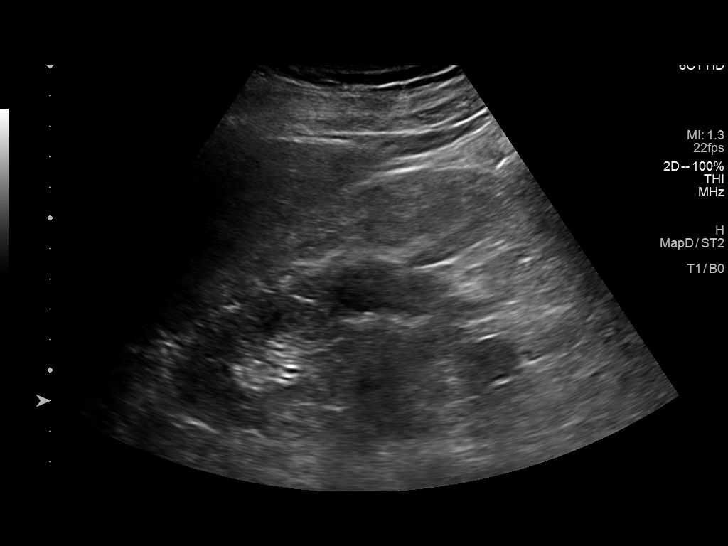
[im 22/65]
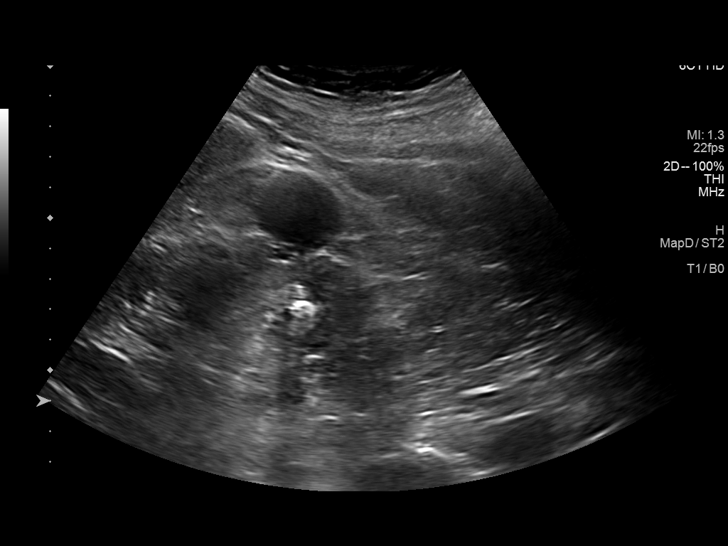
[im 27/65]
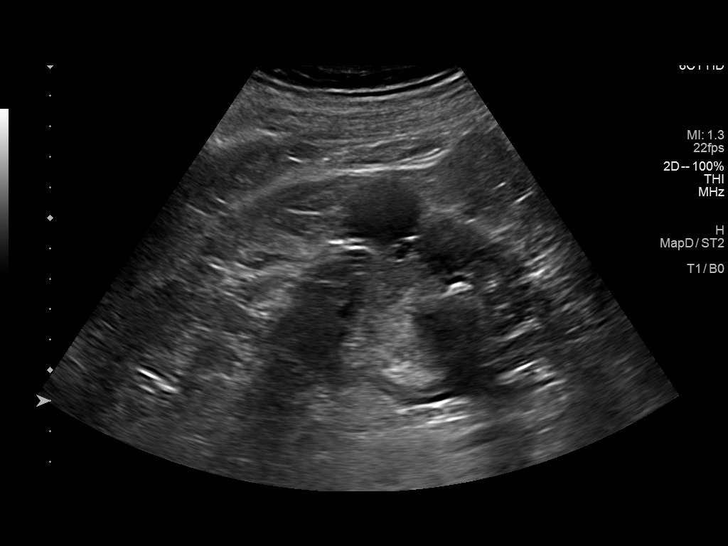
[im 33/65]
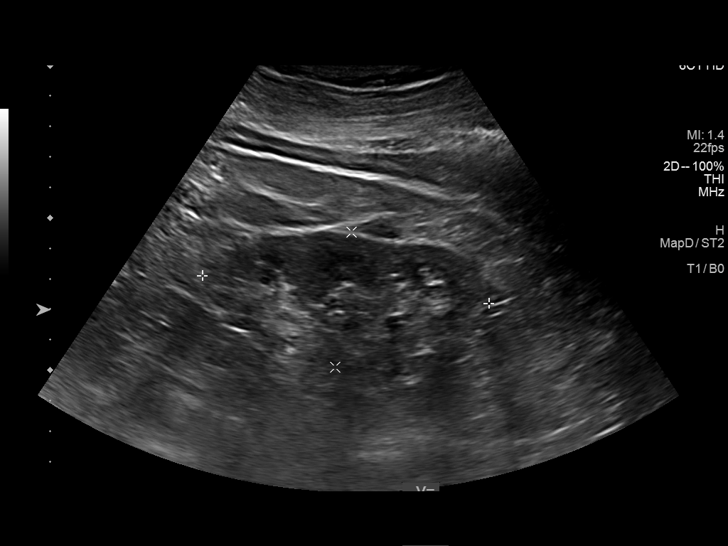
[im 38/65]
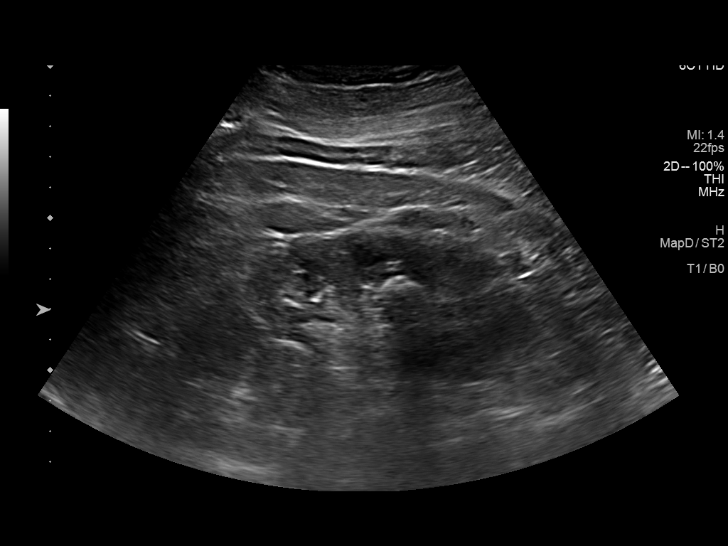
[im 43/65]
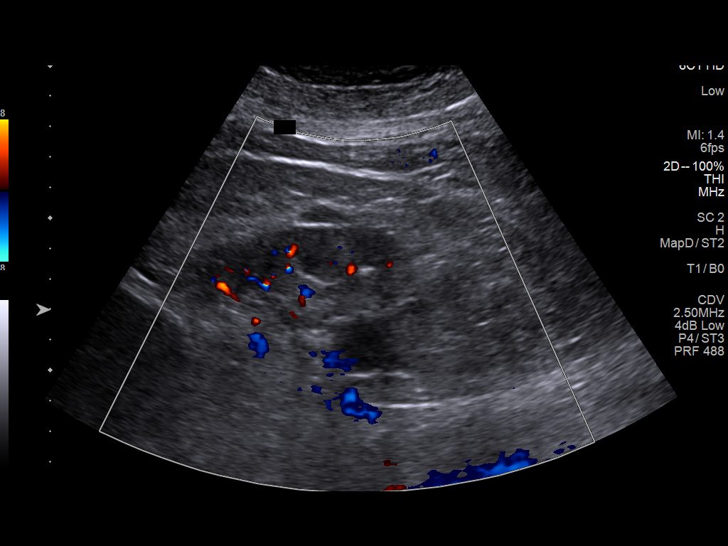
[im 49/65]
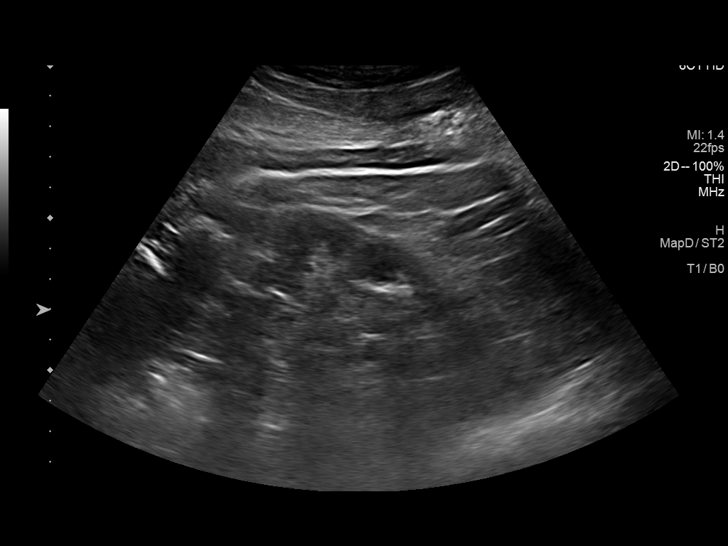
[im 54/65]
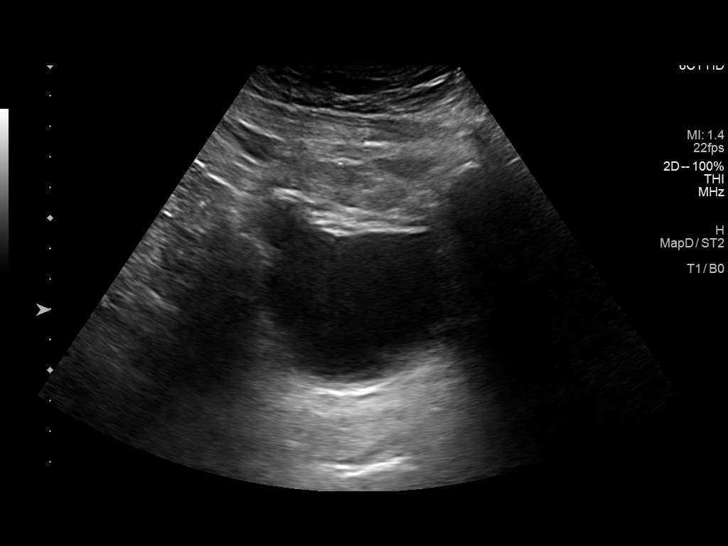
[im 59/65]
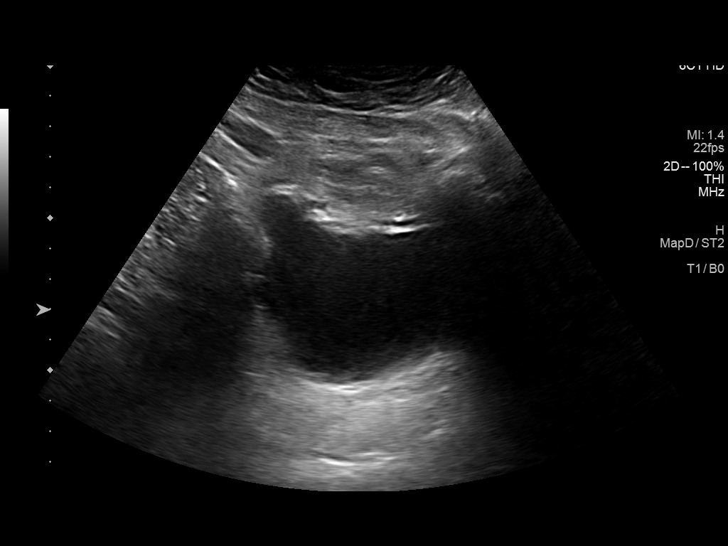
[im 65/65]
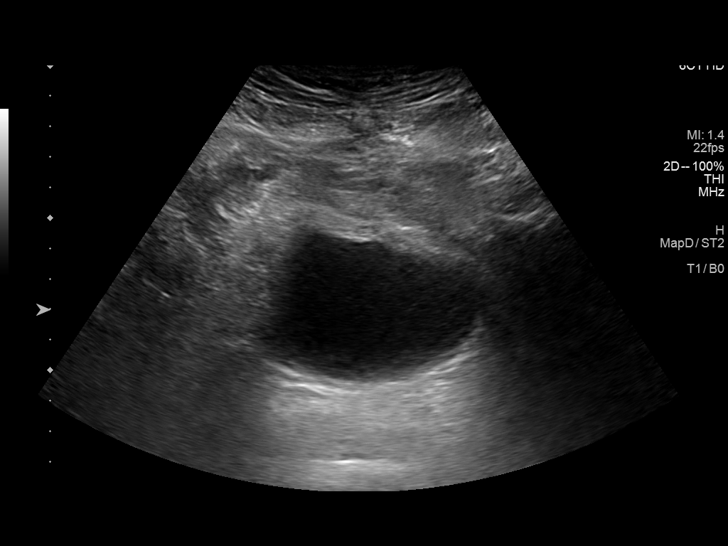

[13 of 25 positions shown; findings below may reference images not displayed]

FINDINGS: Right Kidney:

Renal measurements: 12.2 x 5.5 x 7.8 cm = volume: 272 mL. Mild
diffuse chronic cortical thinning. Echogenicity within normal
limits. 1 cm nonobstructive stone present at the lower pole. No
hydronephrosis. 3.0 x 2.8 x 3.3 cm simple exophytic 6 extends from
the lower pole. Additional 1.2 x 1.0 x 1.7 cm simple exophytic cyst
noted at the lower pole as well.

Left Kidney:

Renal measurements: 9.5 x 4.5 x 6.5 cm = volume: 144 mL. Mild
diffuse chronic cortical thinning. Echogenicity within normal
limits. 1.7 cm shadowing nonobstructive stone at the lower pole. No
hydronephrosis. No discrete renal mass.

Bladder:

Probable small bladder diverticulum seen extending from the right
inferior bladder wall. Bladder otherwise unremarkable.

Other:

None.
IMPRESSION: 1. Mild diffuse chronic cortical thinning about the kidneys
bilaterally.
2. Bilateral nonobstructive nephrolithiasis as above. No
hydronephrosis.
3. Simple right renal cysts measuring up to 3.3 cm as above.
4. Small diverticulum extending from the right inferior bladder
wall.

## 2019-12-22 ENCOUNTER — Ambulatory Visit (INDEPENDENT_AMBULATORY_CARE_PROVIDER_SITE_OTHER): Payer: Medicare Other | Admitting: *Deleted

## 2019-12-22 DIAGNOSIS — I4821 Permanent atrial fibrillation: Secondary | ICD-10-CM

## 2019-12-22 LAB — CUP PACEART REMOTE DEVICE CHECK
Battery Remaining Percentage: 75 %
Brady Statistic RV Percent Paced: 66 %
Date Time Interrogation Session: 20210909070115
Implantable Lead Implant Date: 20181211
Implantable Lead Location: 753860
Implantable Lead Model: 377
Implantable Lead Serial Number: 80620893
Implantable Pulse Generator Implant Date: 20181211
Lead Channel Impedance Value: 566 Ohm
Lead Channel Pacing Threshold Amplitude: 1.1 V
Lead Channel Pacing Threshold Pulse Width: 0.4 ms
Lead Channel Sensing Intrinsic Amplitude: 18.7 mV
Lead Channel Setting Pacing Amplitude: 2 V
Lead Channel Setting Pacing Pulse Width: 0.4 ms
Pulse Gen Model: 407157
Pulse Gen Serial Number: 69171927

## 2019-12-23 NOTE — Progress Notes (Signed)
Remote pacemaker transmission.   

## 2020-02-05 IMAGING — DX DG CHEST 1V PORT
1 series · 1 of 1 positions shown · non-contrast
Comparison: None.

CLINICAL DATA: Near syncopal episode.

EXAM:
PORTABLE CHEST 1 VIEW

[chest ap]
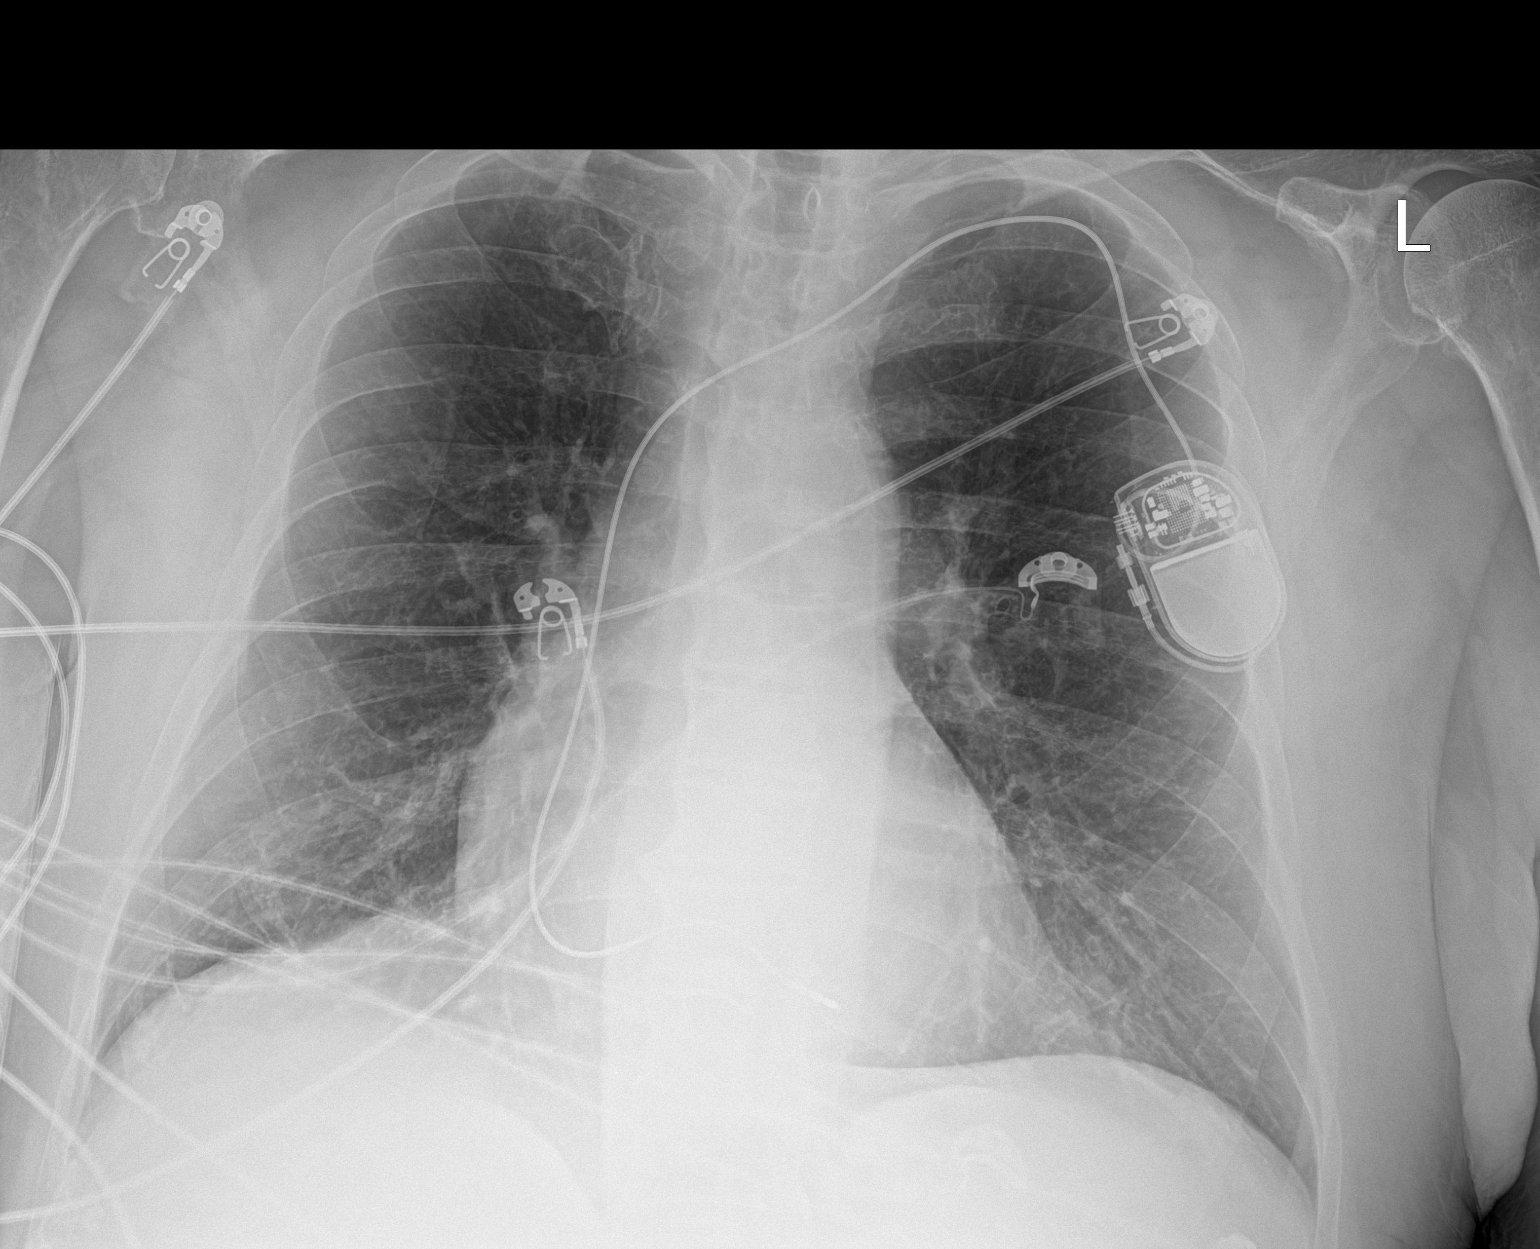

[1 of 1 positions shown; findings below may reference images not displayed]

FINDINGS: There is a single lead ventricular pacer. There is no evidence of
acute infiltrate, pleural effusion or pneumothorax. The heart size
and mediastinal contours are within normal limits. The visualized
skeletal structures are unremarkable.
IMPRESSION: No active disease.

## 2020-03-13 ENCOUNTER — Telehealth: Payer: Self-pay | Admitting: Emergency Medicine

## 2020-03-13 NOTE — Telephone Encounter (Signed)
Patient unavailble to speak to . He relocated to [place] and his son Octavia Bruckner reports he is the HPOA and his contact # only one listed in chart. Tim not on DPR and reports he will be faxing HPOA  Paperwork. Education done on [employer] and HIPPA compliance maintained  [facility] fax # provided. Tim reports patient will be permanently residing in [place] and will be establishing care with EP provider there. He will contact device clinic when patient establishes with EP.

## 2020-03-22 ENCOUNTER — Ambulatory Visit (INDEPENDENT_AMBULATORY_CARE_PROVIDER_SITE_OTHER): Payer: Medicare Other

## 2020-03-22 DIAGNOSIS — I4821 Permanent atrial fibrillation: Secondary | ICD-10-CM

## 2020-03-23 LAB — CUP PACEART REMOTE DEVICE CHECK
Date Time Interrogation Session: 20211209085912
Implantable Lead Implant Date: 20181211
Implantable Lead Location: 753860
Implantable Lead Model: 377
Implantable Lead Serial Number: 80620893
Implantable Pulse Generator Implant Date: 20181211
Pulse Gen Model: 407157
Pulse Gen Serial Number: 69171927

## 2020-04-04 NOTE — Progress Notes (Signed)
Remote pacemaker transmission.   

## 2020-05-15 ENCOUNTER — Encounter: Payer: Medicare Other | Admitting: Internal Medicine

## 2020-06-06 IMAGING — CR DG CHEST 2V
2 series · 2 of 2 positions shown · non-contrast
Comparison: 05/16/2019

CLINICAL DATA: Shortness of breath

EXAM:
CHEST - 2 VIEW

[w chest pa]
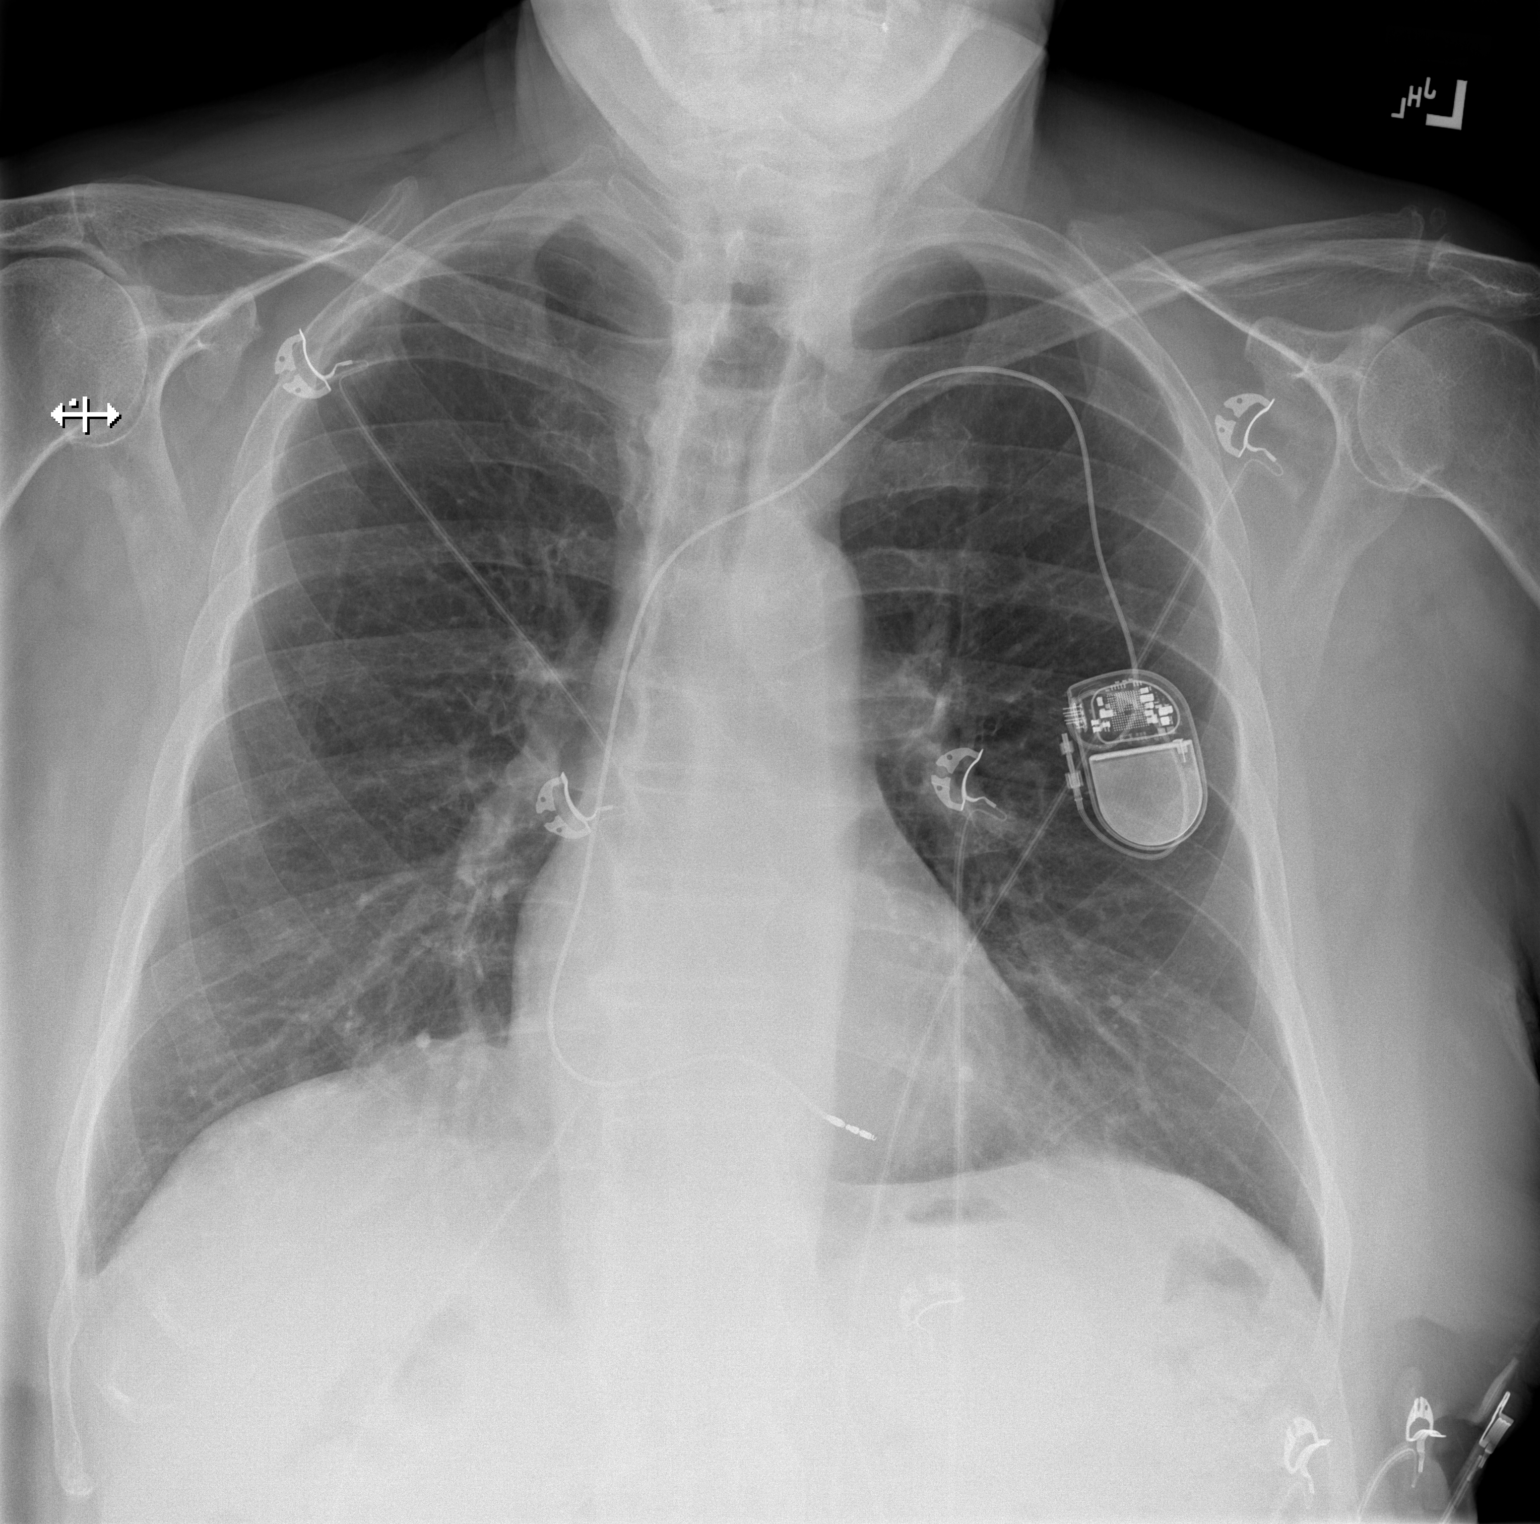

[w chest lat]
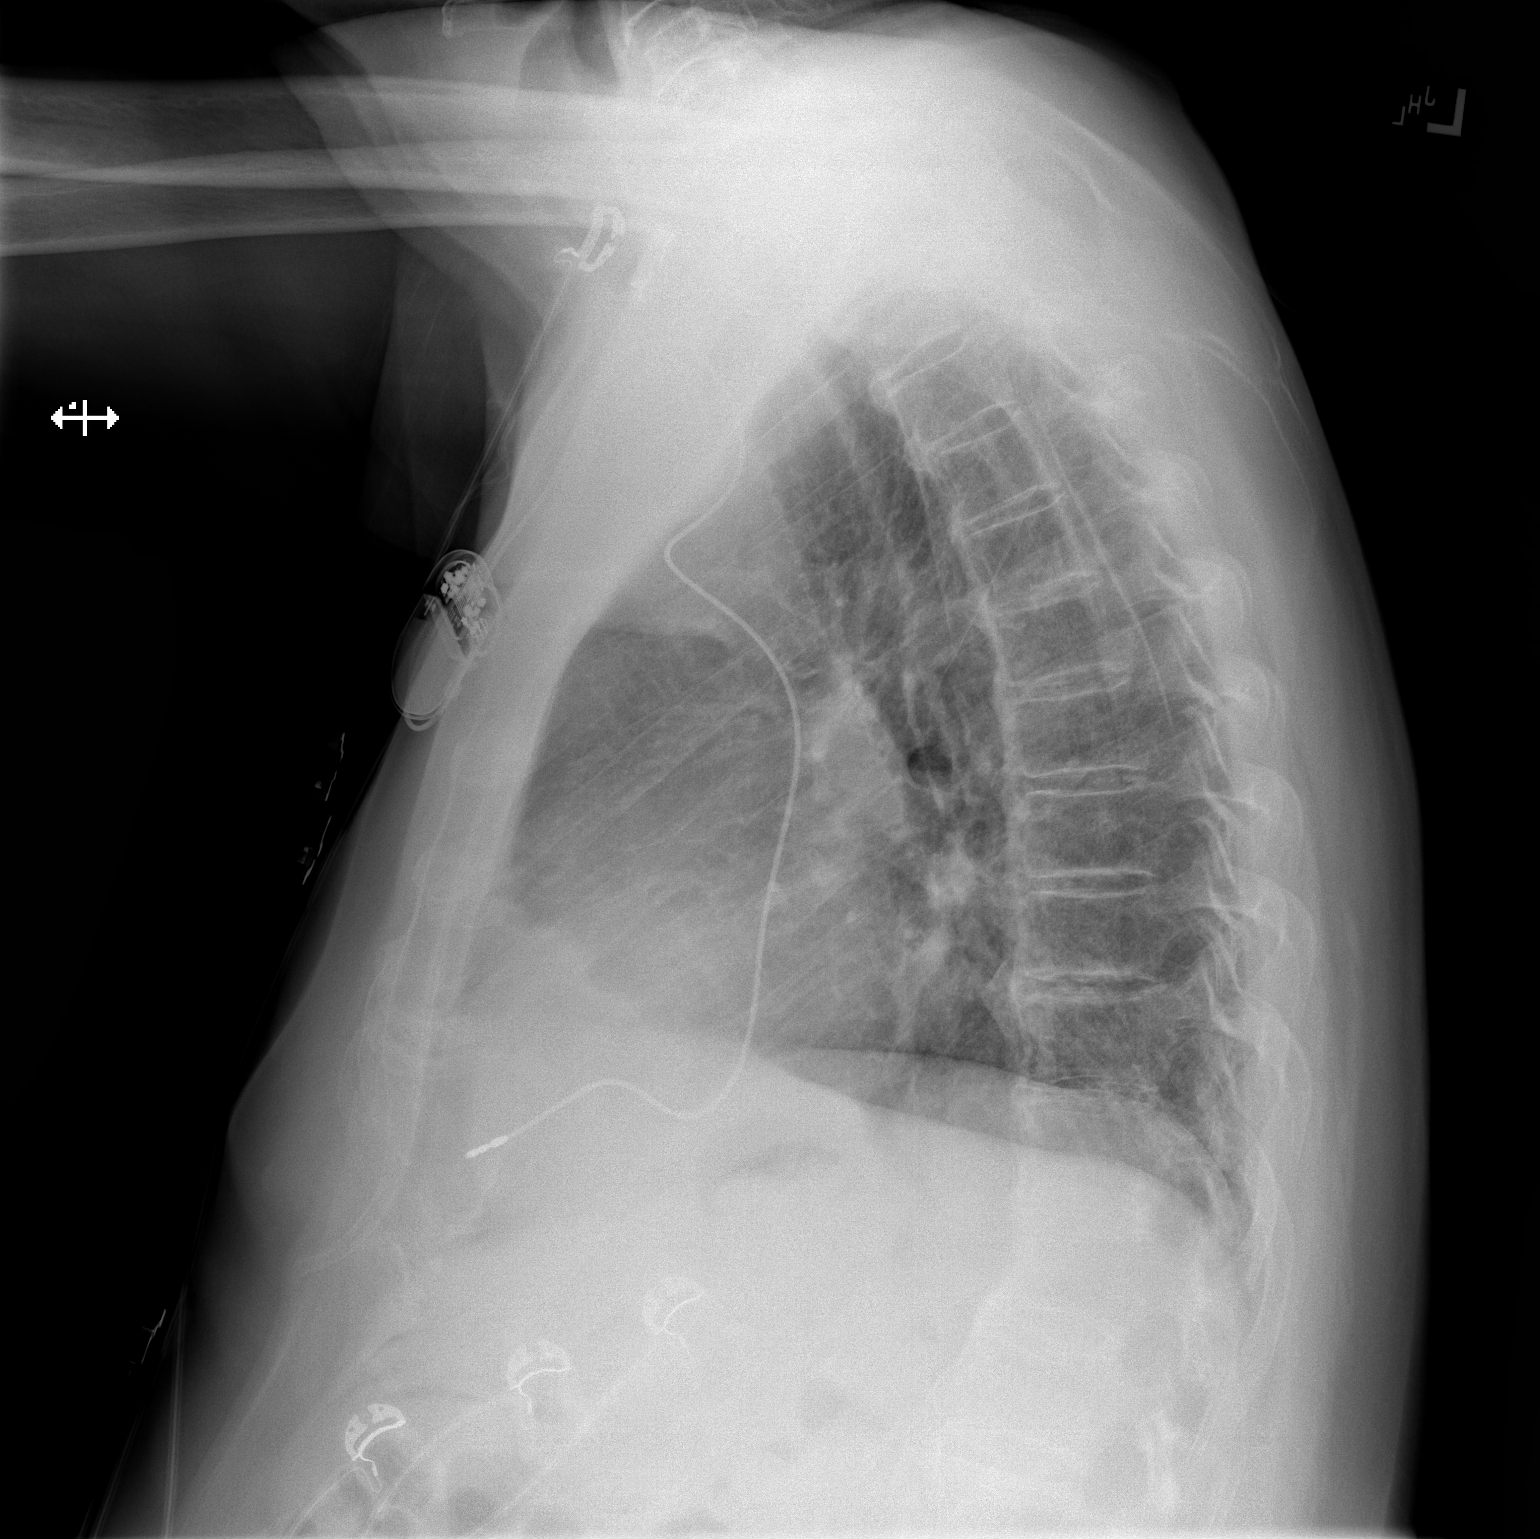

[2 of 2 positions shown; findings below may reference images not displayed]

FINDINGS: Left single lead pacer remains in place, unchanged. Heart and
mediastinal contours are within normal limits. No focal opacities or
effusions. No acute bony abnormality.
IMPRESSION: No active cardiopulmonary disease.

## 2021-02-12 DEATH — deceased
